# Patient Record
Sex: Male | Born: 1975 | Race: Black or African American | Hispanic: No | Marital: Married | Smoking: Never smoker
Health system: Southern US, Community
[De-identification: ages and names within clinical notes are randomized; demographics above are authoritative.]

## PROBLEM LIST (undated history)

## (undated) DIAGNOSIS — N200 Calculus of kidney: Secondary | ICD-10-CM

## (undated) HISTORY — PX: OTHER SURGICAL HISTORY: SHX169

## (undated) HISTORY — DX: Calculus of kidney: N20.0

---

## 2016-12-01 ENCOUNTER — Emergency Department
Admission: EM | Admit: 2016-12-01 | Discharge: 2016-12-01 | Disposition: A | Discharge status: Home / Self Care | Payer: 59 | Attending: Emergency Medicine | Admitting: Emergency Medicine

## 2016-12-01 ENCOUNTER — Encounter: Payer: Self-pay | Admitting: Emergency Medicine

## 2016-12-01 ENCOUNTER — Emergency Department: Payer: 59

## 2016-12-01 DIAGNOSIS — N1330 Unspecified hydronephrosis: Secondary | ICD-10-CM | POA: Diagnosis not present

## 2016-12-01 DIAGNOSIS — N201 Calculus of ureter: Secondary | ICD-10-CM | POA: Insufficient documentation

## 2016-12-01 DIAGNOSIS — R1032 Left lower quadrant pain: Secondary | ICD-10-CM | POA: Diagnosis present

## 2016-12-01 DIAGNOSIS — N23 Unspecified renal colic: Secondary | ICD-10-CM | POA: Diagnosis not present

## 2016-12-01 LAB — CBC
HEMATOCRIT: 39.5 % — AB (ref 40.0–52.0)
Hemoglobin: 13.5 g/dL (ref 13.0–18.0)
MCH: 31.2 pg (ref 26.0–34.0)
MCHC: 34.3 g/dL (ref 32.0–36.0)
MCV: 91 fL (ref 80.0–100.0)
Platelets: 260 10*3/uL (ref 150–440)
RBC: 4.34 MIL/uL — ABNORMAL LOW (ref 4.40–5.90)
RDW: 13 % (ref 11.5–14.5)
WBC: 9.3 10*3/uL (ref 3.8–10.6)

## 2016-12-01 LAB — COMPREHENSIVE METABOLIC PANEL
ALBUMIN: 4.4 g/dL (ref 3.5–5.0)
ALT: 13 U/L — AB (ref 17–63)
AST: 20 U/L (ref 15–41)
Alkaline Phosphatase: 47 U/L (ref 38–126)
Anion gap: 6 (ref 5–15)
BUN: 17 mg/dL (ref 6–20)
CHLORIDE: 106 mmol/L (ref 101–111)
CO2: 28 mmol/L (ref 22–32)
Calcium: 9.2 mg/dL (ref 8.9–10.3)
Creatinine, Ser: 1.16 mg/dL (ref 0.61–1.24)
GFR calc Af Amer: >60 mL/min (ref >60)
GLUCOSE: 111 mg/dL — AB (ref 65–99)
POTASSIUM: 3.4 mmol/L — AB (ref 3.5–5.1)
SODIUM: 140 mmol/L (ref 135–145)
Total Bilirubin: 0.7 mg/dL (ref 0.3–1.2)
Total Protein: 7.7 g/dL (ref 6.5–8.1)

## 2016-12-01 LAB — URINALYSIS, COMPLETE (UACMP) WITH MICROSCOPIC
BACTERIA UA: NONE SEEN
BILIRUBIN URINE: NEGATIVE
Glucose, UA: NEGATIVE mg/dL
Hgb urine dipstick: NEGATIVE
KETONES UR: NEGATIVE mg/dL
LEUKOCYTES UA: NEGATIVE
Nitrite: NEGATIVE
Protein, ur: NEGATIVE mg/dL
SPECIFIC GRAVITY, URINE: 1.025 (ref 1.005–1.030)
pH: 8 (ref 5.0–8.0)

## 2016-12-01 LAB — LIPASE, BLOOD: LIPASE: 35 U/L (ref 11–51)

## 2016-12-01 MED ORDER — ONDANSETRON 4 MG PO TBDP: ORAL_TABLET | ORAL | 0 refills | Status: DC

## 2016-12-01 MED ORDER — OXYCODONE-ACETAMINOPHEN 5-325 MG PO TABS
1.0000 | ORAL_TABLET | ORAL | Status: DC | PRN
  Administered 2016-12-01: 1 via ORAL
  Filled 2016-12-01: qty 1

## 2016-12-01 MED ORDER — TAMSULOSIN HCL 0.4 MG PO CAPS: ORAL_CAPSULE | ORAL | 0 refills | Status: AC

## 2016-12-01 MED ORDER — OXYCODONE-ACETAMINOPHEN 5-325 MG PO TABS: 1.0000 | ORAL_TABLET | ORAL | 0 refills | Status: AC | PRN

## 2016-12-01 MED ORDER — SODIUM CHLORIDE 0.9 % IV SOLN
Freq: Once | INTRAVENOUS | Status: AC
  Administered 2016-12-01: 19:00:00 via INTRAVENOUS
  Filled 2016-12-01: qty 250

## 2016-12-01 MED ORDER — MORPHINE SULFATE (PF) 4 MG/ML IV SOLN
4.0000 mg | Freq: Once | INTRAVENOUS | Status: AC
  Administered 2016-12-01: 4 mg via INTRAVENOUS
  Filled 2016-12-01: qty 1

## 2016-12-01 MED ORDER — DOCUSATE SODIUM 100 MG PO CAPS: ORAL_CAPSULE | ORAL | 0 refills | Status: DC

## 2016-12-01 MED ORDER — LIDOCAINE HCL (CARDIAC) 20 MG/ML IV SOLN: 1.5000 mg/kg | INTRAVENOUS | Status: DC

## 2016-12-01 MED ORDER — MORPHINE SULFATE (PF) 4 MG/ML IV SOLN
4.0000 mg | Freq: Once | INTRAVENOUS | Status: AC
  Administered 2016-12-01: 4 mg via INTRAVENOUS

## 2016-12-01 MED ORDER — HYDROCODONE-ACETAMINOPHEN 5-325 MG PO TABS
2.0000 | ORAL_TABLET | Freq: Once | ORAL | Status: AC
  Administered 2016-12-01: 2 via ORAL
  Filled 2016-12-01: qty 2

## 2016-12-01 MED ORDER — ONDANSETRON HCL 4 MG/2ML IJ SOLN
4.0000 mg | INTRAMUSCULAR | Status: AC
  Administered 2016-12-01: 4 mg via INTRAVENOUS
  Filled 2016-12-01: qty 2

## 2016-12-01 NOTE — ED Notes (Signed)
Pharmacy notified to send Lidocaine infusion.

## 2016-12-01 NOTE — ED Provider Notes (Signed)
[facility] Emergency Department Provider Note  ____________________________________________   First MD Initiated Contact with Patient 12/01/16 1628     (approximate)  I have reviewed the triage vital signs and the nursing notes.   HISTORY  Chief Complaint Abdominal Pain    HPI Jake Grimes is a 41 y.o. male with no chronic medical issuesand no similar issues in the past who presents for evaluation of acute onset severe sharp and aching left flank and left lower quadrant abdominal pain.  It started abruptly about 30 minutes prior to my evaluation of the patient.  Nothing in particular makes the patient's symptoms better nor worse.  It is accompanied with nausea and he had one episode of emesis after coming to the emergency department and getting a Percocet in triage.  The pain was so severe he cannot tell if he is having any difficulty breathing but the pain is definitely in his lower left side and left flank.  He denies dysuria and hematuria.  He denies recent fever/chills, chest pain, upper abdominal pain.  He has no history of kidney stones and no recent history of trauma.   History reviewed. No pertinent past medical history.  There are no active problems to display for this patient.   History reviewed. No pertinent surgical history.  Prior to Admission medications   Medication Sig Start Date End Date Taking? Authorizing Provider  docusate sodium (COLACE) 100 MG capsule Take 1 tablet once or twice daily as needed for constipation while taking narcotic pain medicine 12/01/16   Loleta Rose, MD  ondansetron (ZOFRAN ODT) 4 MG disintegrating tablet Allow 1-2 tablets to dissolve in your mouth every 8 hours as needed for nausea/vomiting 12/01/16   Loleta Rose, MD  oxyCODONE-acetaminophen (ROXICET) 5-325 MG tablet Take 1-2 tablets by mouth every 4 (four) hours as needed for severe pain. 12/01/16   Loleta Rose, MD  tamsulosin (FLOMAX) 0.4 MG CAPS capsule  Take 1 tablet by mouth daily until you pass the kidney stone or no longer have symptoms 12/01/16   Loleta Rose, MD    Allergies Patient has no known allergies.  History reviewed. No pertinent family history.  Social History Social History  Substance Use Topics  . Smoking status: Never Smoker  . Smokeless tobacco: Never Used  . Alcohol use No    Review of Systems Constitutional: No fever/chills Eyes: No visual changes. ENT: No sore throat. Cardiovascular: Denies chest pain. Respiratory: Denies shortness of breath. Gastrointestinal: Severe left-sided flank and left lower quadrant abdominal pain with nausea and one episode of emesis. Genitourinary: Negative for dysuria and hematuria. Musculoskeletal: Negative for neck pain.  Negative for back pain. Integumentary: Negative for rash. Neurological: Negative for headaches, focal weakness or numbness.   ____________________________________________   PHYSICAL EXAM:  VITAL SIGNS: ED Triage Vitals  Enc Vitals Group     BP 12/01/16 1533 (!) 145/86     Pulse Rate 12/01/16 1533 74     Resp 12/01/16 1533 18     Temp 12/01/16 1533 98.1 F (36.7 C)     Temp Source 12/01/16 1533 Oral     SpO2 12/01/16 1533 100 %     Weight 12/01/16 1534 72.6 kg (160 lb)     Height 12/01/16 1534 1.753 m (5\' 9" )     Head Circumference --      Peak Flow --      Pain Score 12/01/16 1533 8     Pain Loc --      Pain  Edu? --      Excl. in GC? --     Constitutional: Alert and oriented.  Generally well-appearing but does appear to be in significant pain Eyes: Conjunctivae are normal.  Head: Atraumatic. Cardiovascular: Normal rate, regular rhythm. Good peripheral circulation. Grossly normal heart sounds. Respiratory: Normal respiratory effort.  No retractions. Lungs CTAB. Gastrointestinal: Soft and nontender to abdominal Palpation but with moderate to severe left CVA tenderness. No distention.  Musculoskeletal: No lower extremity tenderness nor  edema. No gross deformities of extremities.  Left CVA tenderness. Neurologic:  Normal speech and language. No gross focal neurologic deficits are appreciated.  Skin:  Skin is warm, dry and intact. No rash noted.  ____________________________________________   LABS (all labs ordered are listed, but only abnormal results are displayed)  Labs Reviewed  COMPREHENSIVE METABOLIC PANEL - Abnormal; Notable for the following:       Result Value   Potassium 3.4 (*)    Glucose, Bld 111 (*)    ALT 13 (*)    All other components within normal limits  CBC - Abnormal; Notable for the following:    RBC 4.34 (*)    HCT 39.5 (*)    All other components within normal limits  URINALYSIS, COMPLETE (UACMP) WITH MICROSCOPIC - Abnormal; Notable for the following:    Color, Urine YELLOW (*)    APPearance CLEAR (*)    Squamous Epithelial / LPF 0-5 (*)    All other components within normal limits  LIPASE, BLOOD   ____________________________________________  EKG  None - EKG not ordered by ED physician ____________________________________________  RADIOLOGY   Ct Renal Stone Study  Result Date: 12/01/2016 CLINICAL DATA:  41 year old male with left flank pain. Initial encounter. EXAM: CT ABDOMEN AND PELVIS WITHOUT CONTRAST TECHNIQUE: Multidetector CT imaging of the abdomen and pelvis was performed following the standard protocol without IV contrast. COMPARISON:  None. FINDINGS: Lower chest: Minimal atelectasis/ scarring. Hepatobiliary: Taking into account limitation by non contrast imaging, no worrisome hepatic lesion. No calcified gallstones. Pancreas: Taking into account limitation by non contrast imaging, no mass or inflammation. Spleen: Taking into account limitation by non contrast imaging, no mass or enlargement. Adrenals/Urinary Tract: Proximal left ureteral 4 mm obstructing stone located 16.8 cm proximal to the left ureterovesical junction causing mild to moderate left hydronephrosis. No right-sided  hydronephrosis. Taking into account limitation by non contrast imaging, no renal or adrenal mass. Circumferential thickening of partially decompressed urinary bladder. Stomach/Bowel: Evaluation of bowel limited by lack of contrast, distension and fat planes. No obvious primary bowel abnormality noted. No free intraperitoneal air. Vascular/Lymphatic: No aneurysm or adenopathy. Reproductive: No worrisome abnormality. Other: No bowel containing hernia. Musculoskeletal: No osseous destructive lesion. IMPRESSION: Proximal left ureteral 4 mm obstructing stone located 16.8 cm proximal to the left ureterovesical junction causing mild to moderate left hydronephrosis. Circumferential thickening of partially decompressed urinary bladder. Evaluation of bowel limited by lack of contrast, distension and fat planes. No obvious primary bowel abnormality noted. Electronically Signed   By: Lacy Duverney M.D.   On: 12/01/2016 17:06    ____________________________________________   PROCEDURES  Critical Care performed: No   Procedure(s) performed:   Procedures   ____________________________________________   INITIAL IMPRESSION / ASSESSMENT AND PLAN / ED COURSE  Pertinent labs & imaging results that were available during my care of the patient were reviewed by me and considered in my medical decision making (see chart for details).  Signs and symptoms are most consistent with renal colic.  They were very  acute in onset and likely to be the result of an infectious process.  Labs and urinalysis unremarkable.  I will obtain a CT renal stone protocol and provided morphine and Zofran for symptomatic relief and then reassess.   Clinical Course as of Dec 02 2003  Tue Dec 01, 2016  1737 4 mm fairly proximal ureteral stone causing hydronephrosis on the left side, consistent with his presentation.  Patient still in severe pain about 10 minutes after morphine.  I have ordered lidocaine 1.5 mg/kg to be administered with  a 250 mL bag of normal saline infused over 15 minutes.  Will update patient shortly.  [CF]  1749 Patient still in severe pain.  Another dose of morphine 4 mg IV while awaiting lidocaine infusion from pharmacy.  [CF]  1955 Patient is feeling much better after the lidocaine.  He has some mild residual discomfort but it is much improved from before.  He and his wife are comfortable with the plan for discharge with outpatient medications and outpatient follow-up with urology including possible lithotripsy on Thursday if deemed appropriate by Dr. Apolinar JunesBrandon.  I gave my usual and customary return precautions.   No indication for antibiotics since no abnormalities on UA.  [CF]  1957 I reviewed the patient's prescription history over the last 12 months in the multi-state controlled substances database(s) that includes [place], [place], [place], [place], [place], [place], [place], [place], [place], [place], [place], [place], and [place].  The patient has filled no controlled substances during that time.   [CF]    Clinical Course User Index [CF] Loleta RoseForbach, Kenyette Gundy, MD    ____________________________________________  FINAL CLINICAL IMPRESSION(S) / ED DIAGNOSES  Final diagnoses:  Left ureteral stone  Renal colic on left side  Hydronephrosis of left kidney     MEDICATIONS GIVEN DURING THIS VISIT:  Medications  HYDROcodone-acetaminophen (NORCO/VICODIN) 5-325 MG per tablet 2 tablet (not administered)  morphine 4 MG/ML injection 4 mg (4 mg Intravenous Given 12/01/16 1719)  ondansetron (ZOFRAN) injection 4 mg (4 mg Intravenous Given 12/01/16 1719)  morphine 4 MG/ML injection 4 mg (4 mg Intravenous Given 12/01/16 1755)  sodium chloride 0.9 % 250 mL with lidocaine (XYLOCAINE) 2 % 109 mg infusion ( Intravenous New Bag/Given 12/01/16 1907)     NEW OUTPATIENT MEDICATIONS STARTED DURING THIS VISIT:  New Prescriptions   DOCUSATE SODIUM (COLACE) 100 MG CAPSULE    Take 1  tablet once or twice daily as needed for constipation while taking narcotic pain medicine   ONDANSETRON (ZOFRAN ODT) 4 MG DISINTEGRATING TABLET    Allow 1-2 tablets to dissolve in your mouth every 8 hours as needed for nausea/vomiting   OXYCODONE-ACETAMINOPHEN (ROXICET) 5-325 MG TABLET    Take 1-2 tablets by mouth every 4 (four) hours as needed for severe pain.   TAMSULOSIN (FLOMAX) 0.4 MG CAPS CAPSULE    Take 1 tablet by mouth daily until you pass the kidney stone or no longer have symptoms    Modified Medications   No medications on file    Discontinued Medications   No medications on file     Note:  This document was prepared using Dragon voice recognition software and may include unintentional dictation errors.    Loleta RoseForbach, Jeremie Abdelaziz, MD 12/01/16 2005

## 2016-12-01 NOTE — Discharge Instructions (Signed)
You have been seen in the Emergency Department (ED) today for pain that we believe based on your workup, is caused by kidney stones.  As we have discussed, please drink plenty of fluids.  Please make a follow up appointment with the physician(s) listed elsewhere in this documentation.  You may take pain medication as needed but ONLY as prescribed.  Please also take your prescribed Flomax daily.  Please avoid taking medications such as ibuprofen, aspirin, naproxen, or other NSAIDs, at least until you have followed up with Dr. Apolinar JunesBrandon or one of her urology colleagues.  Please see your doctor as soon as possible as stones may take 1-3 weeks to pass and you may require additional care or medications.  Do not drink alcohol, drive or participate in any other potentially dangerous activities while taking opiate pain medication as it may make you sleepy. Do not take this medication with any other sedating medications, either prescription or over-the-counter. If you were prescribed Percocet or Vicodin, do not take these with acetaminophen (Tylenol) as it is already contained within these medications.   Take Percocet as needed for severe pain.  This medication is an opiate (or narcotic) pain medication and can be habit forming.  Use it as little as possible to achieve adequate pain control.  Do not use or use it with extreme caution if you have a history of opiate abuse or dependence.  If you are on a pain contract with your primary care doctor or a pain specialist, be sure to let them know you were prescribed this medication today from the [facility].  This medication is intended for your use only - do not give any to anyone else and keep it in a secure place where nobody else, especially children, have access to it.  It will also cause or worsen constipation, so you may want to consider taking an over-the-counter stool softener while you are taking this medication.  Return to the  Emergency Department (ED) or call your doctor if you have any worsening pain, fever, painful urination, are unable to urinate, or develop other symptoms that concern you.

## 2016-12-01 NOTE — ED Triage Notes (Addendum)
Pt c/o left side/abdominal pain that started 20 minutes ago. No history of kidney stones. No hematuria. Denies NVD. Only complaint is pain.  Ambulatory to triage.  Appears to be hurting, but NAD

## 2016-12-01 NOTE — ED Notes (Signed)
Pt verbalizes understanding of discharge instructions so does wife and pt's mother no further questions

## 2016-12-02 ENCOUNTER — Telehealth: Payer: Self-pay | Admitting: Urology

## 2016-12-02 ENCOUNTER — Encounter: Payer: Self-pay | Admitting: Urology

## 2016-12-02 ENCOUNTER — Ambulatory Visit (INDEPENDENT_AMBULATORY_CARE_PROVIDER_SITE_OTHER): Payer: 59 | Admitting: Urology

## 2016-12-02 VITALS — BP 118/68 | HR 76 | Ht 69.0 in | Wt 160.0 lb

## 2016-12-02 DIAGNOSIS — N201 Calculus of ureter: Secondary | ICD-10-CM | POA: Diagnosis not present

## 2016-12-02 DIAGNOSIS — N1330 Unspecified hydronephrosis: Secondary | ICD-10-CM | POA: Diagnosis not present

## 2016-12-02 DIAGNOSIS — R109 Unspecified abdominal pain: Secondary | ICD-10-CM | POA: Diagnosis not present

## 2016-12-02 NOTE — Telephone Encounter (Signed)
I tried to call him but he doesn't have voicemail. I will keep trying to get him.  Jake DusterMichelle

## 2016-12-02 NOTE — Progress Notes (Signed)
12/02/2016 4:44 PM   Jake Grimes December 02, 1975 [id number]  Referring provider: No referring provider defined for this encounter.  Chief Complaint  Patient presents with  . Nephrolithiasis    New Patient    HPI: 41 year old male who presents today as follow-up from the emergency room. He presented last night with acute onset left flank pain found to have a left proximal 4 mm obstructing calculus with mild to moderate left hydronephrosis.  No other nonobstructing stones were identified.  Since yesterday, his pain is improved dramatically. He still had some flank pain this morning but this afternoon, he is pain-free. He has not been straining his urine. He is unsure if he is passed the stone.  He has no previous history of kidney stones. He does admit to eating a lot of meat and drinking very little water.  He denies any significant urinary symptoms including no dysuria, hematuria, urgency or frequency.  Urinalysis in the emergency room yesterday was unremarkable.  All other labs were also unremarkable.   PMH: Past Medical History:  Diagnosis Date  . Nephrolithiasis     Surgical History: Past Surgical History:  Procedure Laterality Date  . none      Home Medications:  Allergies as of 12/02/2016   No Known Allergies     Medication List       Accurate as of 12/02/16  4:44 PM. Always use your most recent med list.          oxyCODONE-acetaminophen 5-325 MG tablet Commonly known as:  ROXICET Take 1-2 tablets by mouth every 4 (four) hours as needed for severe pain.   tamsulosin 0.4 MG Caps capsule Commonly known as:  FLOMAX Take 1 tablet by mouth daily until you pass the kidney stone or no longer have symptoms       Allergies: No Known Allergies  Family History: Family History  Problem Relation Age of Onset  . Prostate cancer Neg Hx   . Bladder Cancer Neg Hx   . Kidney cancer Neg Hx     Social History:  reports that he has never smoked. He has never used  smokeless tobacco. He reports that he does not drink alcohol or use drugs.  ROS: UROLOGY Frequent Urination?: No Hard to postpone urination?: No Burning/pain with urination?: No Get up at night to urinate?: No Leakage of urine?: No Urine stream starts and stops?: No Trouble starting stream?: No Do you have to strain to urinate?: No Blood in urine?: No Urinary tract infection?: No Sexually transmitted disease?: No Injury to kidneys or bladder?: No Painful intercourse?: No Weak stream?: No Erection problems?: No Penile pain?: No  Gastrointestinal Nausea?: Yes Vomiting?: Yes Indigestion/heartburn?: No Diarrhea?: No Constipation?: No  Constitutional Fever: No Night sweats?: No Weight loss?: No Fatigue?: No  Skin Skin rash/lesions?: No Itching?: No  Eyes Blurred vision?: No Double vision?: No  Ears/Nose/Throat Sore throat?: No Sinus problems?: No  Hematologic/Lymphatic Swollen glands?: No Easy bruising?: No  Cardiovascular Leg swelling?: No Chest pain?: No  Respiratory Cough?: No Shortness of breath?: No  Endocrine Excessive thirst?: Yes  Musculoskeletal Back pain?: Yes Joint pain?: No  Neurological Headaches?: No Dizziness?: No  Psychologic Depression?: No Anxiety?: No  Physical Exam: BP 118/68   Pulse 76   Ht 5\' 9"  (1.753 m)   Wt 160 lb (72.6 kg)   BMI 23.63 kg/m   Constitutional:  Alert and oriented, No acute distress.  Presents with wife today. HEENT:  AT, moist mucus membranes.  Trachea midline,  no masses. Cardiovascular: No clubbing, cyanosis, or edema. Respiratory: Normal respiratory effort, no increased work of breathing. GI: Abdomen is soft, nontender, nondistended, no abdominal masses GU: No CVA tenderness.  Skin: No rashes, bruises or suspicious lesions. Neurologic: Grossly intact, no focal deficits, moving all 4 extremities. Psychiatric: Normal mood and affect.  Laboratory Data: Lab Results  Component Value Date    WBC 9.3 12/01/2016   HGB 13.5 12/01/2016   HCT 39.5 (L) 12/01/2016   MCV 91.0 12/01/2016   PLT 260 12/01/2016    Lab Results  Component Value Date   CREATININE 1.16 12/01/2016    Urinalysis    Component Value Date/Time   COLORURINE YELLOW (A) 12/01/2016 1535   APPEARANCEUR CLEAR (A) 12/01/2016 1535   LABSPEC 1.025 12/01/2016 1535   PHURINE 8.0 12/01/2016 1535   GLUCOSEU NEGATIVE 12/01/2016 1535   HGBUR NEGATIVE 12/01/2016 1535   BILIRUBINUR NEGATIVE 12/01/2016 1535   KETONESUR NEGATIVE 12/01/2016 1535   PROTEINUR NEGATIVE 12/01/2016 1535   NITRITE NEGATIVE 12/01/2016 1535   LEUKOCYTESUR NEGATIVE 12/01/2016 1535    Pertinent Imaging: CLINICAL DATA:  41 year old male with left flank pain. Initial encounter.  EXAM: CT ABDOMEN AND PELVIS WITHOUT CONTRAST  TECHNIQUE: Multidetector CT imaging of the abdomen and pelvis was performed following the standard protocol without IV contrast.  COMPARISON:  None.  FINDINGS: Lower chest: Minimal atelectasis/ scarring.  Hepatobiliary: Taking into account limitation by non contrast imaging, no worrisome hepatic lesion. No calcified gallstones.  Pancreas: Taking into account limitation by non contrast imaging, no mass or inflammation.  Spleen: Taking into account limitation by non contrast imaging, no mass or enlargement.  Adrenals/Urinary Tract: Proximal left ureteral 4 mm obstructing stone located 16.8 cm proximal to the left ureterovesical junction causing mild to moderate left hydronephrosis.  No right-sided hydronephrosis.  Taking into account limitation by non contrast imaging, no renal or adrenal mass.  Circumferential thickening of partially decompressed urinary bladder.  Stomach/Bowel: Evaluation of bowel limited by lack of contrast, distension and fat planes. No obvious primary bowel abnormality noted. No free intraperitoneal air.  Vascular/Lymphatic: No aneurysm or adenopathy.  Reproductive:  No worrisome abnormality.  Other: No bowel containing hernia.  Musculoskeletal: No osseous destructive lesion.  IMPRESSION: Proximal left ureteral 4 mm obstructing stone located 16.8 cm proximal to the left ureterovesical junction causing mild to moderate left hydronephrosis.  Circumferential thickening of partially decompressed urinary bladder.  Evaluation of bowel limited by lack of contrast, distension and fat planes. No obvious primary bowel abnormality noted.   Electronically Signed   By: Lacy Duverney M.D.   On: 12/01/2016 17:06  CT scan personally reviewed today and with the patient.    Assessment & Plan:    1. Left ureteral calculus 4 mm proximal left ureteral stone, pain improving No signs or symptoms of infection We discussed options for management including medical expulsive therapy versus ureteroscopy versus shockwave lithotripsy. This time he is comfortable and would like to continue medical expulsive therapy. Warning symptoms were reviewed with the patient, all questions were answered He will follow-up in 2 weeks with a KUB or call if he desires sooner intervention He passes his stone, he will bring it in for stone analysis and cancels follow-up In addition, we discussed general stone prevention techniques including drinking plenty water with goal of producing 2.5 L urine daily, increased citric acid intake, avoidance of high oxalate containing foods, and decreased salt intake.  Information about dietary recommendations given today.   - Urinalysis, Complete - DG  Abd 1 View; Future  2. Hydronephrosis, left Secondary to #1  3. Left flank pain Secondary to #1 Narcotics prn Continue flomax    Return in about 2 weeks (around 12/16/2016) [facility] .  Vanna ScotlandAshley Jacky Dross, MD  [facility] [address], [address] [place], [place] [address] [phone]

## 2016-12-02 NOTE — Telephone Encounter (Signed)
I got in touch with the patient and he will be here at 3:45 today since we have never seen him before.  Thanks,  Jake DusterMichelle

## 2016-12-03 LAB — URINALYSIS, COMPLETE
BILIRUBIN UA: NEGATIVE
LEUKOCYTES UA: NEGATIVE
Nitrite, UA: NEGATIVE
Urobilinogen, Ur: 0.2 mg/dL (ref 0.2–1.0)
pH, UA: 5.5 (ref 5.0–7.5)

## 2016-12-03 LAB — MICROSCOPIC EXAMINATION: Bacteria, UA: NONE SEEN

## 2016-12-24 ENCOUNTER — Ambulatory Visit: Payer: 59 | Admitting: Urology

## 2018-03-20 IMAGING — CT CT RENAL STONE PROTOCOL
3 of 4 series · 9 of 46 positions shown, 16 images · non-contrast
Comparison: None.

CLINICAL DATA: 41-year-old male with left flank pain. Initial
encounter.

EXAM:
CT ABDOMEN AND PELVIS WITHOUT CONTRAST
TECHNIQUE: Multidetector CT imaging of the abdomen and pelvis was performed
following the standard protocol without IV contrast.

[Series 4: lung bases · axial · 0.66mm/px · z∈[-203,-108]mm · 5 of 29 slices shown, 10 images]
[im 5/29  soft-tissue]
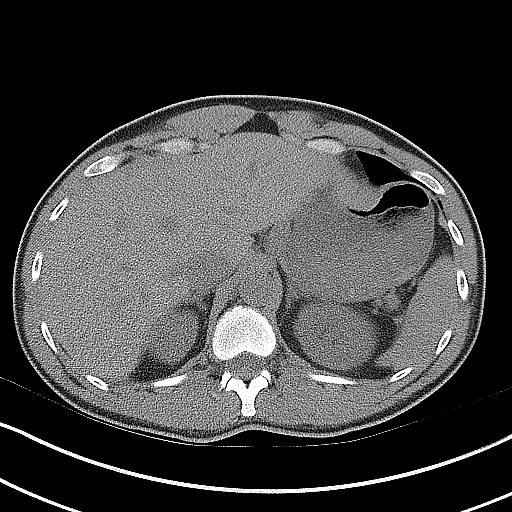
[im 5/29  bone]
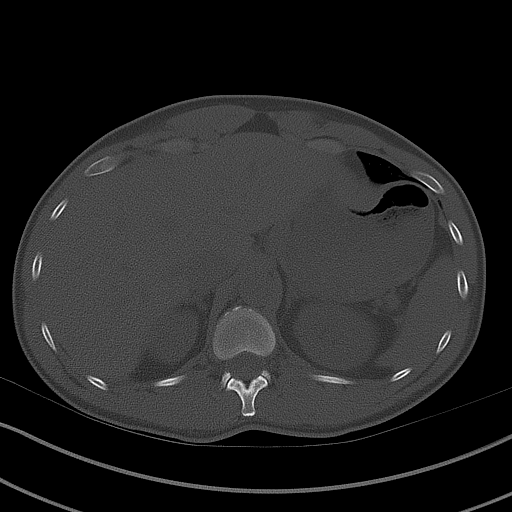
[im 10/29  soft-tissue]
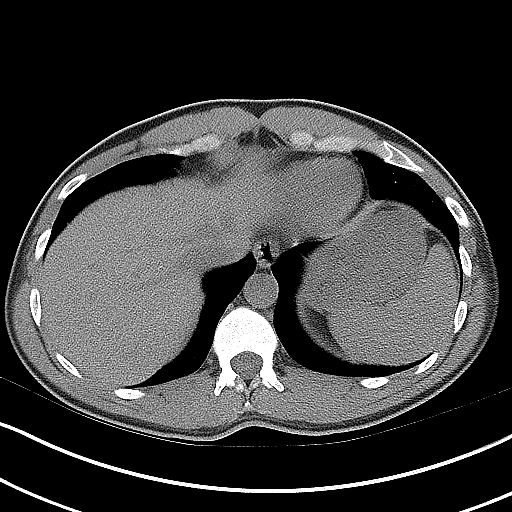
[im 10/29  lung]
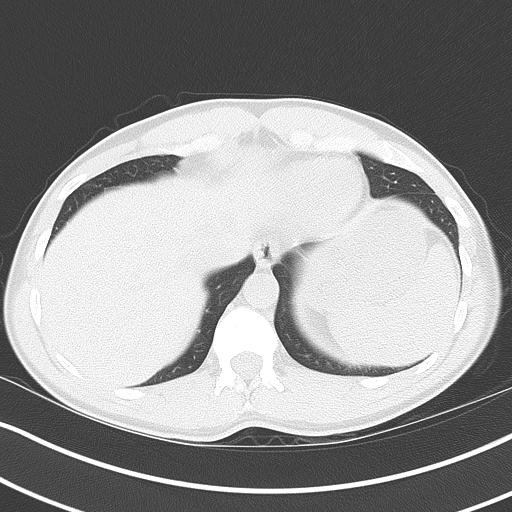
[im 15/29  soft-tissue]
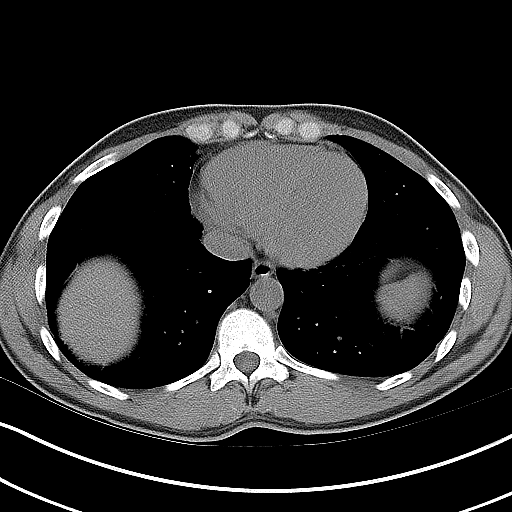
[im 15/29  lung]
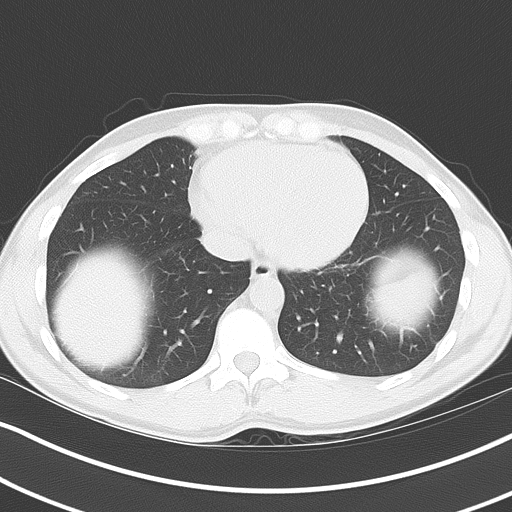
[im 19/29  soft-tissue]
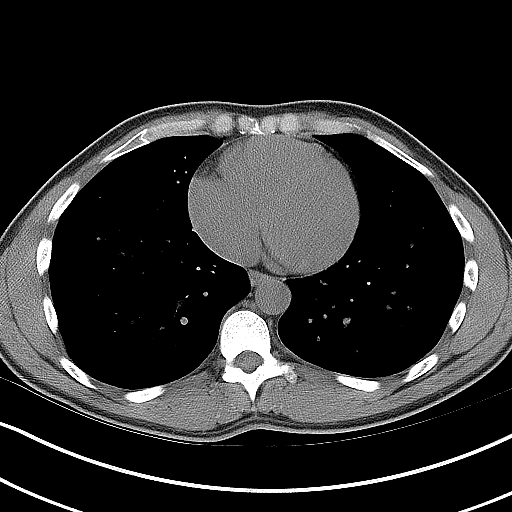
[im 19/29  lung]
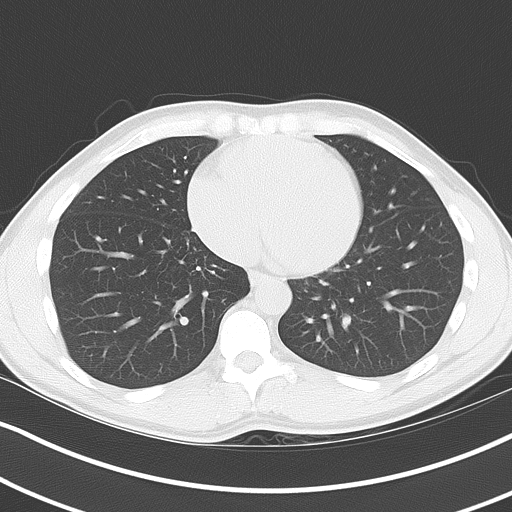
[im 24/29  soft-tissue]
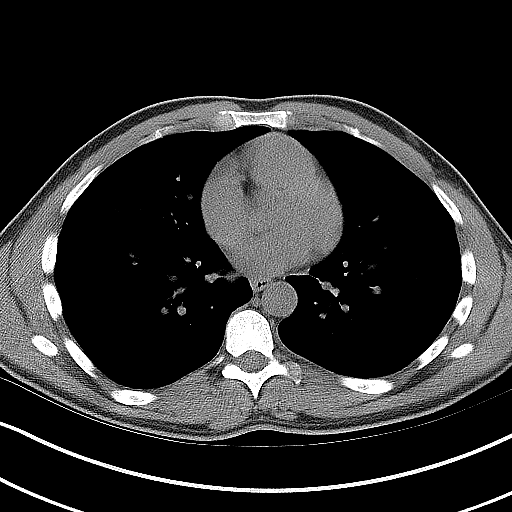
[im 24/29  lung]
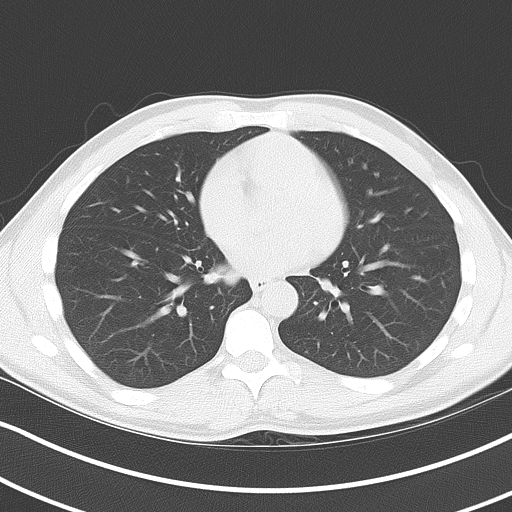

[Series 5: coronal · coronal · 0.74mm/px · 3 of 116 slices shown, 4 images]
[im 39/116  soft-tissue]
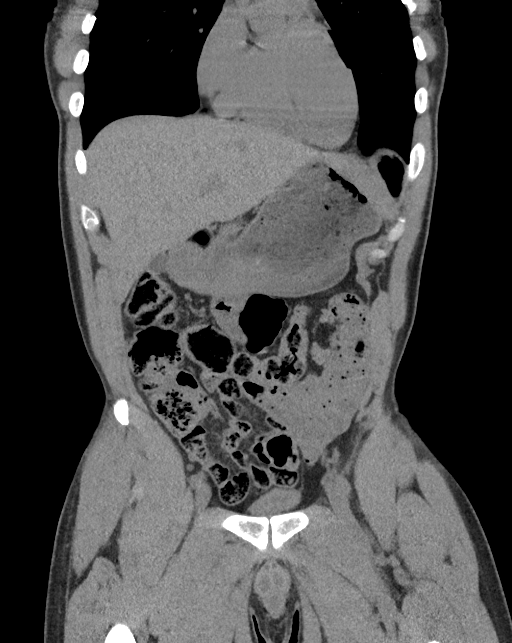
[im 52/116  soft-tissue]
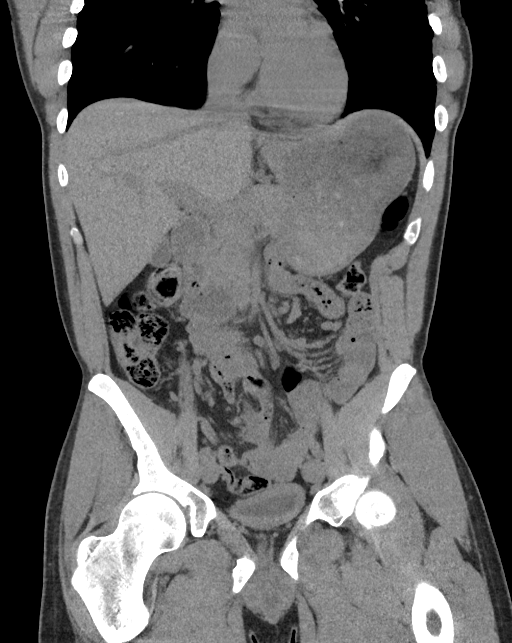
[im 52/116  bone]
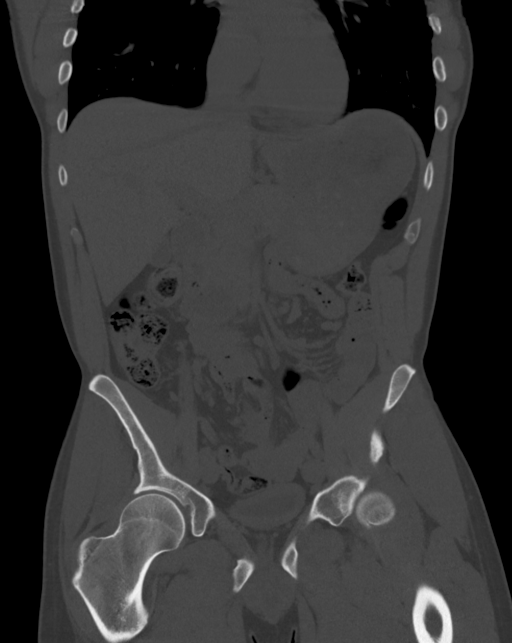
[im 64/116  soft-tissue]
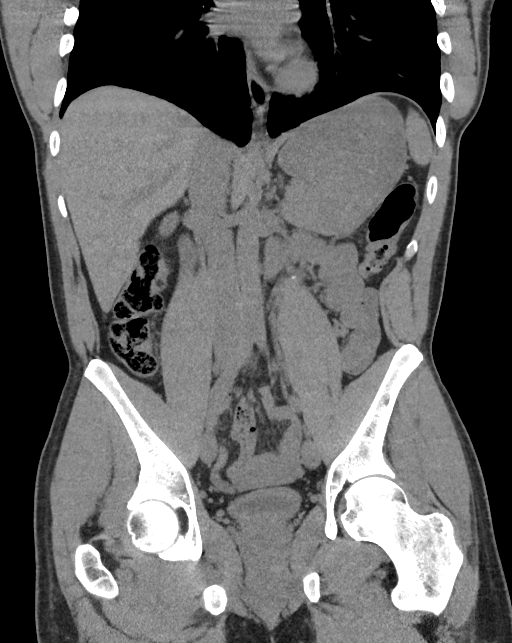

[Series 6: sagittal · sagittal · 0.56mm/px · 1 of 164 slices shown, 2 images]
[im 55/164  soft-tissue]
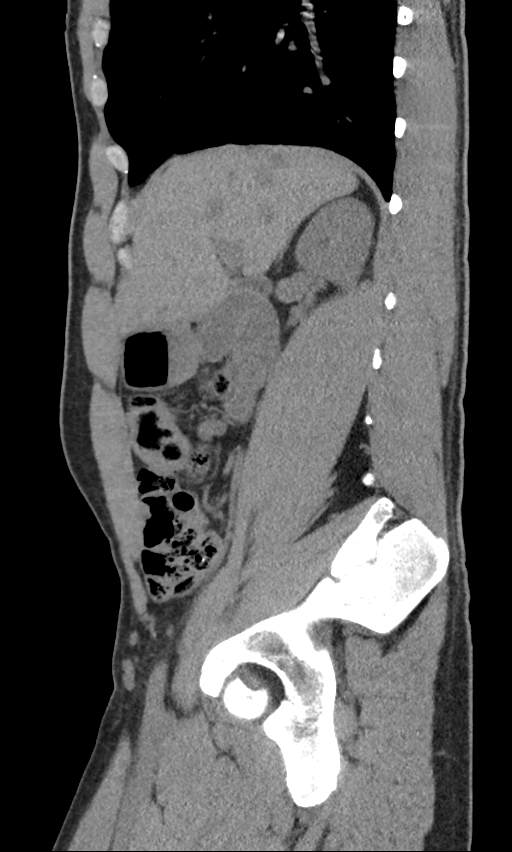
[im 55/164  bone]
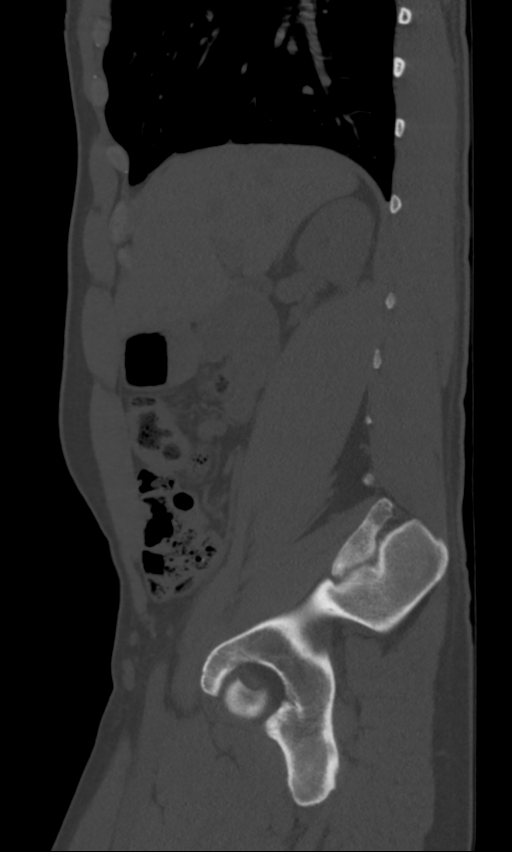

[9 of 46 positions shown; findings below may reference images not displayed]

FINDINGS: Lower chest: Minimal atelectasis/ scarring.

Hepatobiliary: Taking into account limitation by non contrast
imaging, no worrisome hepatic lesion. No calcified gallstones.

Pancreas: Taking into account limitation by non contrast imaging, no
mass or inflammation.

Spleen: Taking into account limitation by non contrast imaging, no
mass or enlargement.

Adrenals/Urinary Tract: Proximal left ureteral 4 mm obstructing
stone located 16.8 cm proximal to the left ureterovesical junction
causing mild to moderate left hydronephrosis.

No right-sided hydronephrosis.

Taking into account limitation by non contrast imaging, no renal or
adrenal mass.

Circumferential thickening of partially decompressed urinary
bladder.

Stomach/Bowel: Evaluation of bowel limited by lack of contrast,
distension and fat planes. No obvious primary bowel abnormality
noted. No free intraperitoneal air.

Vascular/Lymphatic: No aneurysm or adenopathy.

Reproductive: No worrisome abnormality.

Other: No bowel containing hernia.

Musculoskeletal: No osseous destructive lesion.
IMPRESSION: Proximal left ureteral 4 mm obstructing stone located 16.8 cm
proximal to the left ureterovesical junction causing mild to
moderate left hydronephrosis.

Circumferential thickening of partially decompressed urinary
bladder.

Evaluation of bowel limited by lack of contrast, distension and fat
planes. No obvious primary bowel abnormality noted.
